# Patient Record
Sex: Female | Born: 1980 | Race: White | Hispanic: No | Marital: Married | Smoking: Never smoker
Health system: Southern US, Community
[De-identification: ages and names within clinical notes are randomized; demographics above are authoritative.]

---

## 2019-11-05 ENCOUNTER — Emergency Department (INDEPENDENT_AMBULATORY_CARE_PROVIDER_SITE_OTHER)
Admission: EM | Admit: 2019-11-05 | Discharge: 2019-11-05 | Disposition: A | Discharge status: Home / Self Care | Payer: BLUE CROSS/BLUE SHIELD | Source: Home / Self Care | Attending: Family Medicine | Admitting: Family Medicine

## 2019-11-05 ENCOUNTER — Other Ambulatory Visit: Payer: Self-pay

## 2019-11-05 ENCOUNTER — Emergency Department (INDEPENDENT_AMBULATORY_CARE_PROVIDER_SITE_OTHER): Payer: BLUE CROSS/BLUE SHIELD

## 2019-11-05 DIAGNOSIS — R05 Cough: Secondary | ICD-10-CM

## 2019-11-05 DIAGNOSIS — R059 Cough, unspecified: Secondary | ICD-10-CM

## 2019-11-05 DIAGNOSIS — J189 Pneumonia, unspecified organism: Secondary | ICD-10-CM | POA: Diagnosis not present

## 2019-11-05 DIAGNOSIS — D649 Anemia, unspecified: Secondary | ICD-10-CM

## 2019-11-05 DIAGNOSIS — R918 Other nonspecific abnormal finding of lung field: Secondary | ICD-10-CM

## 2019-11-05 DIAGNOSIS — D75839 Thrombocytosis, unspecified: Secondary | ICD-10-CM

## 2019-11-05 LAB — POCT CBC W AUTO DIFF (K'VILLE URGENT CARE)

## 2019-11-05 LAB — POCT FASTING CBG KUC MANUAL ENTRY: POCT Glucose (KUC): 100 mg/dL — AB (ref 70–99)

## 2019-11-05 NOTE — ED Notes (Signed)
Patient is being discharged from the [facility] and sent to the Emergency Department via POV driven by husband, refused ambulance transport. Per Dr. Cathren Harsh, patient is stable but in need of higher level of care due to critical blood work and recommendation for a CT for x-ray result follow-up. Patient's vitals and mentation are suspicious for sepsis. Patient and husband are aware and verbalize understanding of plan of care.  Vitals:   11/05/19 1849  BP: 120/83  Pulse: (!) 118  Resp: (!) 26  Temp: 100.2 F (37.9 C)  SpO2: 99%

## 2019-11-05 NOTE — ED Triage Notes (Signed)
Patient presents to Urgent Care with complaints of cough, fever, and sore throat since almost 3 weeks ago. Patient reports she traveled to [place] in April, since then has not been herself per husband. Pt reportedly was better over the weekend and then took a downhill turn.

## 2019-11-05 NOTE — ED Notes (Signed)
MD made aware of critical blood work.

## 2019-11-06 MED ORDER — PANTOPRAZOLE SODIUM 40 MG IV SOLR
40.00 | INTRAVENOUS | Status: DC
Start: 2019-11-06 — End: 2019-11-06

## 2019-11-06 MED ORDER — ACETAMINOPHEN 325 MG PO TABS
650.00 | ORAL_TABLET | ORAL | Status: DC
Start: ? — End: 2019-11-06

## 2019-11-06 MED ORDER — MELATONIN 3 MG PO TABS
6.00 | ORAL_TABLET | ORAL | Status: DC
Start: ? — End: 2019-11-06

## 2019-11-06 MED ORDER — PANTOPRAZOLE SODIUM 40 MG PO TBEC
40.00 | DELAYED_RELEASE_TABLET | ORAL | Status: DC
Start: 2019-11-09 — End: 2019-11-06

## 2019-11-06 MED ORDER — SODIUM CHLORIDE 0.9 % IV SOLN
250.00 | INTRAVENOUS | Status: DC
Start: ? — End: 2019-11-06

## 2019-11-06 MED ORDER — GENERIC EXTERNAL MEDICATION
1.25 | Status: DC
Start: 2019-11-07 — End: 2019-11-06

## 2019-11-06 MED ORDER — CETIRIZINE HCL 5 MG PO TABS
5.00 | ORAL_TABLET | ORAL | Status: DC
Start: 2019-11-09 — End: 2019-11-06

## 2019-11-06 MED ORDER — GENERIC EXTERNAL MEDICATION
3.00 | Status: DC
Start: 2019-11-08 — End: 2019-11-06

## 2019-11-06 MED ORDER — GENERIC EXTERNAL MEDICATION
7.50 | Status: DC
Start: 2019-11-08 — End: 2019-11-06

## 2019-11-06 MED ORDER — GENERIC EXTERNAL MEDICATION
15.00 | Status: DC
Start: 2019-11-08 — End: 2019-11-06

## 2019-11-07 LAB — NOVEL CORONAVIRUS, NAA: SARS-CoV-2, NAA: NOT DETECTED

## 2019-11-07 LAB — SARS-COV-2, NAA 2 DAY TAT

## 2019-11-07 MED ORDER — SODIUM CHLORIDE 3 % IN NEBU
3.00 | INHALATION_SOLUTION | RESPIRATORY_TRACT | Status: DC
Start: ? — End: 2019-11-07

## 2019-11-07 MED ORDER — ALBUTEROL SULFATE (2.5 MG/3ML) 0.083% IN NEBU
2.50 | INHALATION_SOLUTION | RESPIRATORY_TRACT | Status: DC
Start: ? — End: 2019-11-07

## 2019-11-08 MED ORDER — BENZONATATE 100 MG PO CAPS
100.00 | ORAL_CAPSULE | ORAL | Status: DC
Start: ? — End: 2019-11-08

## 2019-11-08 NOTE — ED Provider Notes (Signed)
[facility]    CSN: [id number] Arrival date & time: 11/05/19  [place]      History   Chief Complaint Chief Complaint  Patient presents with  . Cough  . Fever    HPI Makayla Conrad is a 39 y.o. female.   Patient complains of a persistent cough for over three weeks. She participated in an e-visit with her PCP 12 days ago and prescribed COVID testing and symptomatic treatment. Patient and husband report that she has developed a worsening cough during the past three days, with onset of pleuritic pain in her right lateral chest, sore throat when coughing, increased shortness of breath, and fatigue.  She has had night sweats during the past week, and fever to 100.5 during the past two days.  Her cough is partly productive.  She reports that she has lost about 30 pounds during thee past 6 months. She has a history of IgA deficiency, with numerous past episodes of pneumonia  The history is provided by the patient and the spouse.  Cough Cough characteristics:  Productive Sputum characteristics:  Nondescript Severity:  Moderate Onset quality:  Gradual Duration:  3 weeks Timing:  Constant Progression:  Worsening Chronicity:  Recurrent Smoker: no   Relieved by:  Nothing Worsened by:  Activity Ineffective treatments:  None tried Associated symptoms: chest pain, chills, diaphoresis, fever, shortness of breath, sore throat and weight loss   Associated symptoms: no ear fullness, no ear pain, no eye discharge, no headaches, no myalgias, no rash, no rhinorrhea, no sinus congestion and no wheezing   Fever Associated symptoms: chest pain, chills, cough and sore throat   Associated symptoms: no congestion, no ear pain, no headaches, no myalgias, no rash and no rhinorrhea      Past medical history:  GAD, GERD, IgA deficiency, frequent pneumonia  Current problems:  GAD, GERD, IgA deficiency, Insomnia   Surgical history:  Gastric bypass, tubal ligation  OB History   No obstetric  history on file.      Home Medications    Prior to Admission medications   Medication Sig Start Date End Date Taking? Authorizing Provider  escitalopram (LEXAPRO) 10 MG tablet Take 10 mg by mouth daily.   Yes [provider]    Family History Family History  Problem Relation Age of Onset  . Healthy Mother   . Healthy Father     Social History Social History   Tobacco Use  . Smoking status: Never Smoker  . Smokeless tobacco: Never Used  Substance Use Topics  . Alcohol use: Not on file  . Drug use: Not on file     Allergies   Patient has no known allergies.   Review of Systems Review of Systems  Constitutional: Positive for activity change, appetite change, chills, diaphoresis, fatigue, fever, unexpected weight change and weight loss.  HENT: Positive for sore throat. Negative for congestion, ear pain, rhinorrhea and sinus pain.   Eyes: Negative for discharge.  Respiratory: Positive for cough, chest tightness and shortness of breath. Negative for wheezing and stridor.   Cardiovascular: Positive for chest pain. Negative for leg swelling.  Gastrointestinal: Negative.   Genitourinary: Negative.   Musculoskeletal: Negative for myalgias.  Skin: Negative for rash.  Neurological: Positive for weakness and light-headedness. Negative for headaches.      Physical Exam Triage Vital Signs ED Triage Vitals  Enc Vitals Group     BP 11/05/19 1849 120/83     Pulse Rate 11/05/19 1849 (!) 118  Resp 11/05/19 1849 (!) 26     Temp 11/05/19 1849 100.2 F (37.9 C)     Temp Source 11/05/19 1849 Oral     SpO2 11/05/19 1849 99 %     Weight --      Height --      Head Circumference --      Peak Flow --      Pain Score 11/05/19 1846 0     Pain Loc --      Pain Edu? --      Excl. in GC? --    No data found.  Updated Vital Signs BP 120/83 (BP Location: Right Arm)   Pulse (!) 118   Temp 100.2 F (37.9 C) (Oral)   Resp (!) 26   SpO2 99%   Visual Acuity Right  Eye Distance:   Left Eye Distance:   Bilateral Distance:    Right Eye Near:   Left Eye Near:    Bilateral Near:     Physical Exam  Nursing notes and Vital Signs reviewed. Appearance:  Patient appears cachectic, pale, and older that stated age.  Otherwise she is in no acute distress Eyes:  Pupils are equal, round, and reactive to light and accomodation.  Extraocular movement is intact.  Conjunctivae are not inflamed  Ears:  Canals normal.  Tympanic membranes normal.  Nose:  Normal turbinates.  No sinus tenderness.  Neck:  Supple.  No adenopathy  Lungs:  Clear to auscultation.  Breath sounds are equal.  Rate 26 Heart:  Regular rate and rhythm without murmurs, rubs, or gallops. Rate 118 Abdomen:  Nontender without masses or hepatosplenomegaly.  Bowel sounds are present.  No CVA or flank tenderness.  Extremities:  No edema.  Skin:  No rash present.   UC Treatments / Results  Labs (all labs ordered are listed, but only abnormal results are displayed) Labs Reviewed  POCT FASTING CBG [facility] MANUAL ENTRY - Abnormal; Notable for the following components:      Result Value   POCT Glucose ([facility]) 100 (*)    All other components within normal limits  NOVEL CORONAVIRUS, NAA   Narrative:    Performed at:  [address] [place], [place]  [id number] Lab Director: Jolene Schimke MD, Phone:  [phone]  SARS-COV-2, NAA 2 DAY TAT   Narrative:    Performed at:  [address]  [address] [place], [place]  [id number] Lab Director: Jolene Schimke MD, Phone:  [phone]  POCT CBC W AUTO DIFF ([facility]):  WBC 15.0; LY 10.7; MO 7.1; GR 82.2; Hgb 6.4; Platelets 1144     EKG  Rate:  102 BPM PR:  130 msec QT:  326 msec QTcH:  424 msec QRSD:  84 msec QRS axis:  88 degrees Interpretation:  Sinus tachycardia, otherwise within normal limits   Radiology Final Result  CLINICAL DATA:  Cough and pleuritic right chest pain  EXAM: CHEST - 2 VIEW   COMPARISON:  None.  FINDINGS: Masslike opacity in the right mid lung likely within the right upper lobe on lateral imaging. Some surrounding patchy consolidative opacities are seen as well. Streaky basilar atelectasis is noted. Lungs are otherwise clear. Cardiomediastinal contours are unremarkable. No pneumothorax or visible effusion. No acute osseous or soft tissue abnormality.  IMPRESSION: Masslike opacity in the right mid lung, likely within the right upper lobe on lateral imaging. Could be infectious or malignant. Recommend further evaluation with CT.  Some surrounding consolidative opacities could  suggest infectious process or postobstructive pneumonia.   Electronically Signed   By: Kreg Shropshire M.D.   On: 11/05/2019 19:59     Procedures Procedures (including critical care time)  Medications Ordered in UC Medications - No data to display  Initial Impression / Assessment and Plan / UC Course  I have reviewed the triage vital signs and the nursing notes.  Pertinent labs & imaging results that were available during my care of the patient were reviewed by me and considered in my medical decision making (see chart for details).    Patient discharged to care of husband who agrees to transport patient immediately to [facility] ED for evaluation.  Patient's vital signs are stable and she is safe to travel by private vehicle.  Patient and husband decline EMS transport.   Final Clinical Impressions(s) / UC Diagnoses   Final diagnoses:  Cough  Pneumonia of right upper lobe due to infectious organism  Mass of upper lobe of right lung  Thrombocytosis (HCC)  Severe anemia   Discharge Instructions   None    ED Prescriptions    None        Lattie Haw, MD 11/08/19 1600

## 2020-09-24 DEATH — deceased

## 2021-10-13 IMAGING — DX DG CHEST 2V
2 series · 2 of 2 positions shown · non-contrast
Comparison: None.

CLINICAL DATA: Cough and pleuritic right chest pain

EXAM:
CHEST - 2 VIEW

[chest lat]
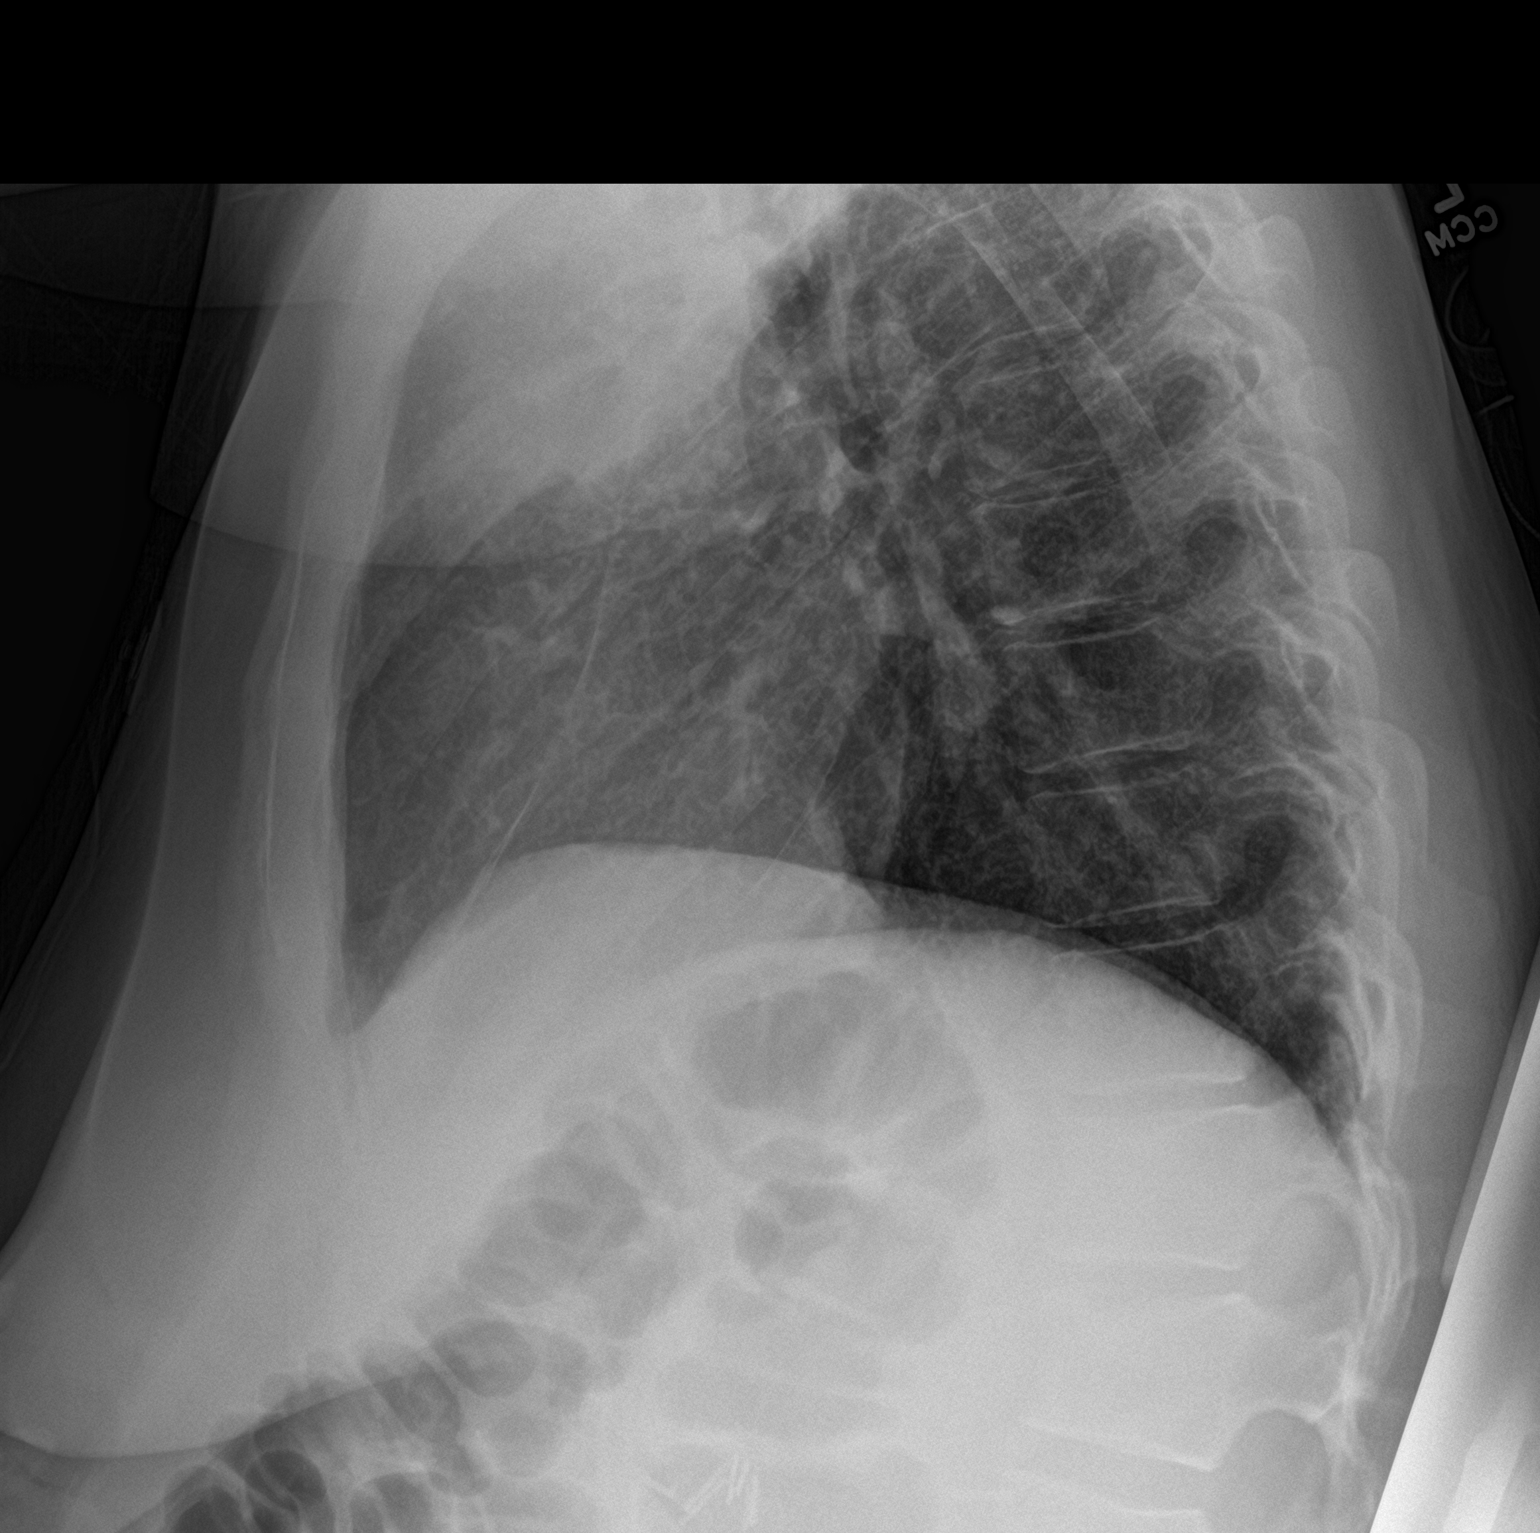

[chest ap]
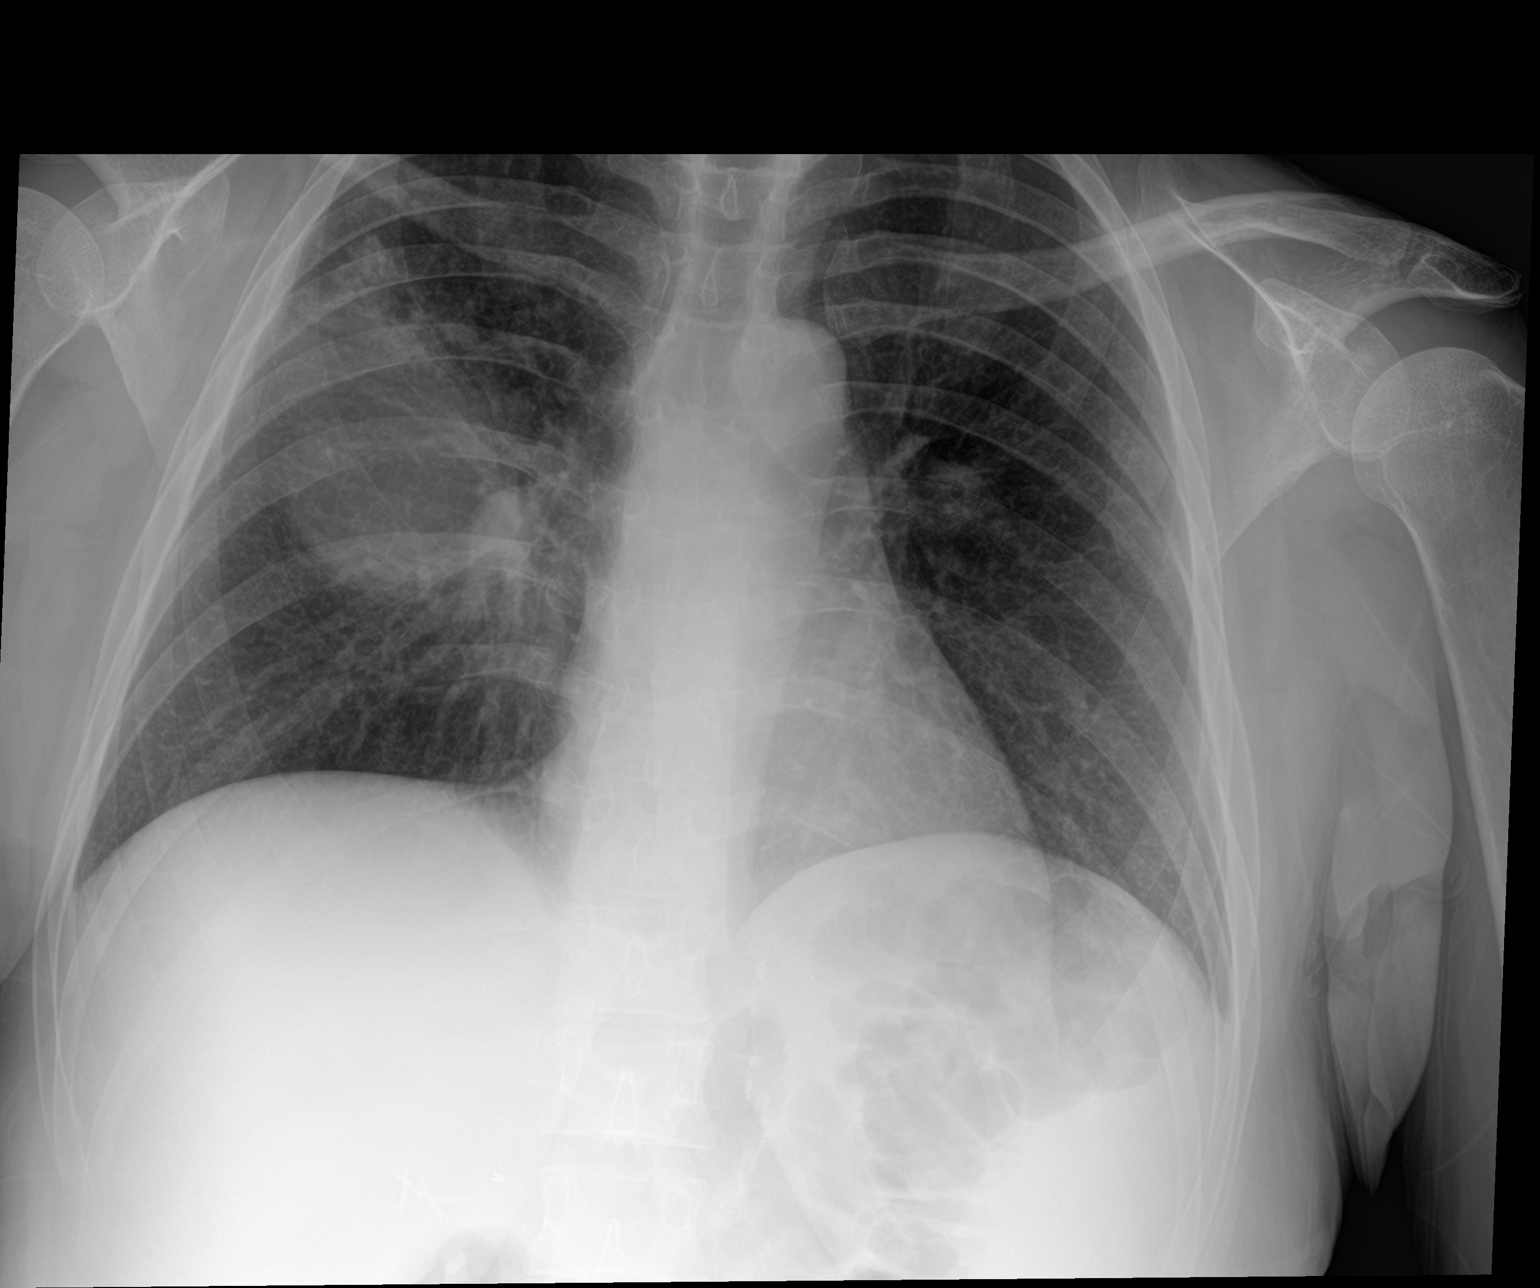

[2 of 2 positions shown; findings below may reference images not displayed]

FINDINGS: Masslike opacity in the right mid lung likely within the right upper
lobe on lateral imaging. Some surrounding patchy consolidative
opacities are seen as well. Streaky basilar atelectasis is noted.
Lungs are otherwise clear. Cardiomediastinal contours are
unremarkable. No pneumothorax or visible effusion. No acute osseous
or soft tissue abnormality.
IMPRESSION: Masslike opacity in the right mid lung, likely within the right
upper lobe on lateral imaging. Could be infectious or malignant.
Recommend further evaluation with CT.

Some surrounding consolidative opacities could suggest infectious
process or postobstructive pneumonia.
